# Patient Record
Sex: Male | Born: 1955 | Race: White | Hispanic: No | Marital: Married | Smoking: Former smoker
Health system: Southern US, Community
[De-identification: ages and names within clinical notes are randomized; demographics above are authoritative.]

---

## 2014-08-03 ENCOUNTER — Emergency Department (HOSPITAL_COMMUNITY)
Admission: EM | Admit: 2014-08-03 | Discharge: 2014-08-03 | Disposition: A | Discharge status: Home / Self Care | Payer: BLUE CROSS/BLUE SHIELD | Attending: Emergency Medicine | Admitting: Emergency Medicine

## 2014-08-03 ENCOUNTER — Encounter (HOSPITAL_COMMUNITY): Payer: Self-pay | Admitting: *Deleted

## 2014-08-03 ENCOUNTER — Emergency Department (HOSPITAL_COMMUNITY): Payer: BLUE CROSS/BLUE SHIELD

## 2014-08-03 DIAGNOSIS — R220 Localized swelling, mass and lump, head: Secondary | ICD-10-CM | POA: Diagnosis present

## 2014-08-03 DIAGNOSIS — D170 Benign lipomatous neoplasm of skin and subcutaneous tissue of head, face and neck: Secondary | ICD-10-CM | POA: Insufficient documentation

## 2014-08-03 DIAGNOSIS — Z87891 Personal history of nicotine dependence: Secondary | ICD-10-CM | POA: Diagnosis not present

## 2014-08-03 DIAGNOSIS — K115 Sialolithiasis: Secondary | ICD-10-CM | POA: Insufficient documentation

## 2014-08-03 LAB — I-STAT CHEM 8, ED
BUN: 10 mg/dL (ref 6–20)
CALCIUM ION: 1.21 mmol/L (ref 1.12–1.23)
CHLORIDE: 99 mmol/L — AB (ref 101–111)
Creatinine, Ser: 1 mg/dL (ref 0.61–1.24)
Glucose, Bld: 187 mg/dL — ABNORMAL HIGH (ref 65–99)
HEMATOCRIT: 47 % (ref 39.0–52.0)
Hemoglobin: 16 g/dL (ref 13.0–17.0)
Potassium: 4.4 mmol/L (ref 3.5–5.1)
Sodium: 139 mmol/L (ref 135–145)
TCO2: 26 mmol/L (ref 0–100)

## 2014-08-03 MED ORDER — CEPHALEXIN 500 MG PO CAPS: 500.0000 mg | ORAL_CAPSULE | Freq: Four times a day (QID) | ORAL | Status: AC

## 2014-08-03 MED ORDER — IOHEXOL 300 MG/ML  SOLN
75.0000 mL | Freq: Once | INTRAMUSCULAR | Status: AC | PRN
  Administered 2014-08-03: 75 mL via INTRAVENOUS

## 2014-08-03 NOTE — ED Provider Notes (Signed)
CSN: [id number]     Arrival date & time 08/03/14  2104 History   First MD Initiated Contact with Patient 08/03/14 2135     Chief Complaint  Patient presents with  . Facial Swelling     (Consider location/radiation/quality/duration/timing/severity/associated sxs/prior Treatment) HPI   Carl Mcguire is a 59 y.o. male who presents to the Emergency Department complaining of sudden onset of right jaw swelling.  Patient has a hx of a chronic mass to his right neck that was evaluated in 2002 that began as a "small cyst" that has gradually progressed in size over the years.  He states that he has "put off" having further medical evaluation due to work commitments.  Tonight, jaw swelling developed suddenly after eating.  He denies fever, dental pain, ear pain, chest pain, shortness of breath or nausea.   History reviewed. No pertinent past medical history. History reviewed. No pertinent past surgical history. History reviewed. No pertinent family history. History  Substance Use Topics  . Smoking status: Former Research scientist (life sciences)  . Smokeless tobacco: Not on file  . Alcohol Use: No    Review of Systems  Constitutional: Negative for fever and appetite change.  HENT: Negative for congestion, dental problem, ear pain, facial swelling, sore throat and trouble swallowing.        Right jaw swelling  Eyes: Negative for visual disturbance.  Respiratory: Negative for shortness of breath.   Cardiovascular: Negative for chest pain.  Gastrointestinal: Negative for nausea and abdominal pain.  Musculoskeletal: Negative for neck pain and neck stiffness.  Neurological: Negative for dizziness, syncope, facial asymmetry, speech difficulty and headaches.  Hematological: Negative for adenopathy.  All other systems reviewed and are negative.     Allergies  Review of patient's allergies indicates no known allergies.  Home Medications   Prior to Admission medications   Not on File   BP 173/99 mmHg  Pulse 79   Temp(Src) 98.5 F (36.9 C) (Oral)  Resp 20  Ht 6' 3.5" (1.918 m)  Wt 260 lb (117.935 kg)  BMI 32.06 kg/m2  SpO2 96% Physical Exam  Constitutional: He is oriented to person, place, and time. He appears well-developed and well-nourished. No distress.  HENT:  Head: Atraumatic.  Right Ear: Tympanic membrane and ear canal normal.  Left Ear: Tympanic membrane and ear canal normal.  Mouth/Throat: Uvula is midline, oropharynx is clear and moist and mucous membranes are normal. No oral lesions. No trismus in the jaw. No dental abscesses or uvula swelling.  Mild localized edema just distal to the angle of the right mandible. No dental tenderness.   Eyes: EOM are normal. Pupils are equal, round, and reactive to light.  Neck: No JVD present. No tracheal deviation present. No thyromegaly present.  Large, fluctuance mass to the right supraclavicular area.  No erythema or excessive warmth.    Cardiovascular: Normal rate, regular rhythm, normal heart sounds and intact distal pulses.   No murmur heard. Pulmonary/Chest: Effort normal and breath sounds normal. No respiratory distress.  Musculoskeletal: Normal range of motion.  Neurological: He is alert and oriented to person, place, and time. He exhibits normal muscle tone. Coordination normal.  Skin: Skin is warm. No rash noted.  Psychiatric: He has a normal mood and affect.  Nursing note and vitals reviewed.   ED Course  Procedures (including critical care time) Labs Review Labs Reviewed  I-STAT CHEM 8, ED - Abnormal; Notable for the following:    Chloride 99 (*)    Glucose, Bld 187 (*)  All other components within normal limits    Imaging Review Ct Soft Tissue Neck W Contrast  08/03/2014   CLINICAL DATA:  Right-sided neck swelling today with no known injury. Right shoulder mass which has been present for multiple years.  EXAM: CT NECK WITH CONTRAST  TECHNIQUE: Multidetector CT imaging of the neck was performed using the standard protocol  following the bolus administration of intravenous contrast.  CONTRAST:  1mL OMNIPAQUE IOHEXOL 300 MG/ML  SOLN  COMPARISON:  None.  FINDINGS: There is a 13 cm fatty mass in the right supraclavicular fossa and right neck with visible internal vessels but no suspicious nodularity or septation. There is displacement of the right external jugular vein and medialization of the right carotid sheath with partial effacement of the internal jugular vein. The lateral margin of the right sternocleidomastoid is displaced and thinned.  Pharynx and larynx: No asymmetric fullness or enhancement.  Salivary glands: Single stone in the right parotid gland. No evidence of active inflammation.  Thyroid: Unremarkable  Lymph nodes: No adenopathy.  Vascular: Venous distortion on the right as above. The major cervical vessels are patent.  Limited intracranial: Negative  Visualized orbits: Negative  Mastoids and visualized paranasal sinuses: There is patchy mucosal opacification in bilateral ethmoid sinuses. Probable mucous retention cysts in the inferior maxillary antra, nonobstructive.  Skeleton: No contributory findings.  Upper chest: Clear apical lungs.  IMPRESSION: 13 cm right neck and supraclavicular fossa lipoma. The mass distorts the sternocleidomastoid and internal/external jugular veins.   Electronically Signed   By: Monte Fantasia M.D.   On: 08/03/2014 22:53     EKG Interpretation None      MDM   Final diagnoses:  Parotid sialolithiasis  Lipoma of neck    Pt also seen by Dr. Christy Gentles and care plan discussed which includes chem 8 and CT scan of soft tissue neck    Pt appears comfortable.  Vitals stable,airway patent.    CT scan reveals likely lipoma and parotid gland stone.  Pt stable for d/c. rx for keflex, pt agrees to sour candy and close f/u.  Pt also given strict return precautions.      Kem Parkinson, PA-C 08/05/14 1400  Ripley Fraise, MD 08/05/14 2237

## 2014-08-03 NOTE — ED Notes (Signed)
Dr Christy Gentles at bedside,

## 2014-08-03 NOTE — ED Notes (Signed)
Pt alert & oriented x4, stable gait. Patient given discharge instructions, paperwork & prescription(s). Patient  instructed to stop at the registration desk to finish any additional paperwork. Patient verbalized understanding. Pt left department w/ no further questions. 

## 2014-08-03 NOTE — ED Notes (Signed)
Pt with right facial pain and swelling today, pt with large cyst to right side of neck for years, denies trouble breathing or swallowing

## 2016-04-05 IMAGING — CT CT NECK W/ CM
3 of 4 series · 13 of 33 positions shown, 16 images · IV contrast (omnipaque)
Comparison: None.

CLINICAL DATA: Right-sided neck swelling today with no known
injury. Right shoulder mass which has been present for multiple
years.

EXAM:
CT NECK WITH CONTRAST
TECHNIQUE: Multidetector CT imaging of the neck was performed using the
standard protocol following the bolus administration of intravenous
contrast.
CONTRAST:  75mL OMNIPAQUE IOHEXOL 300 MG/ML  SOLN

[Series 2: soft tissue neck 2.0 b31s · axial · 0.65mm/px · z∈[-84,+100]mm · 5 of 139 slices shown, 7 images]
[im 24/139  soft-tissue]
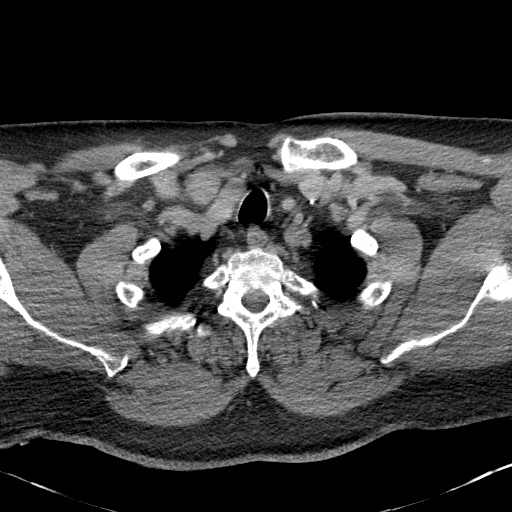
[im 24/139  bone]
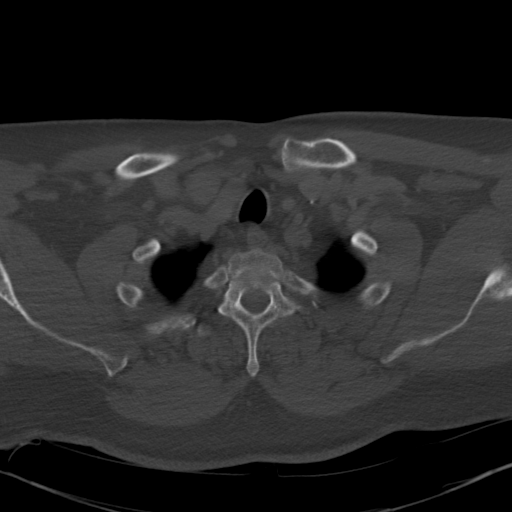
[im 47/139  bone]
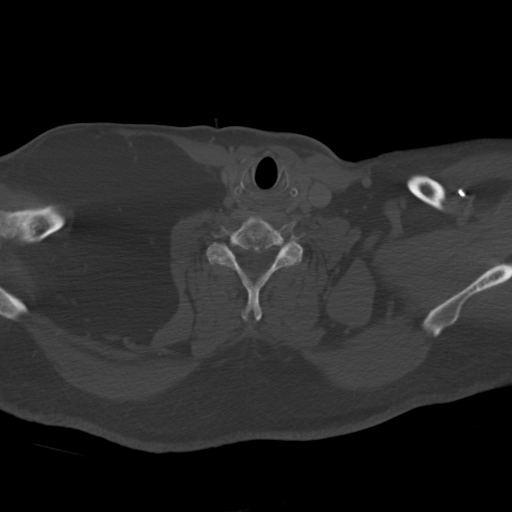
[im 70/139  bone]
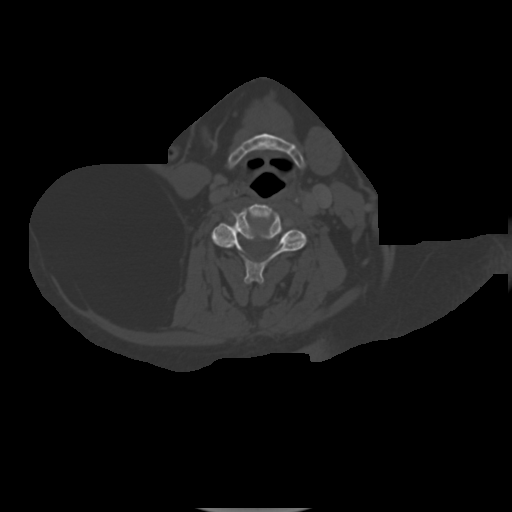
[im 93/139  bone]
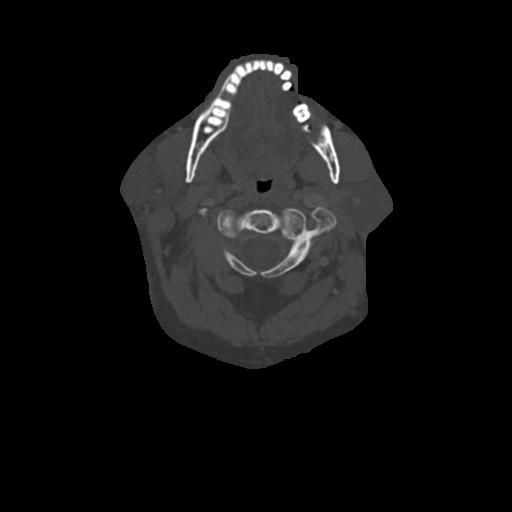
[im 116/139  soft-tissue]
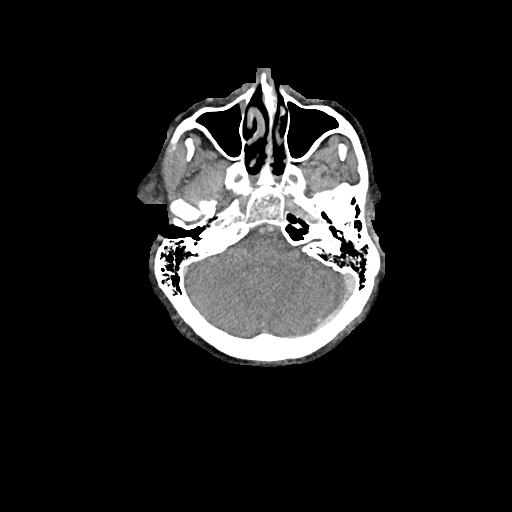
[im 116/139  bone]
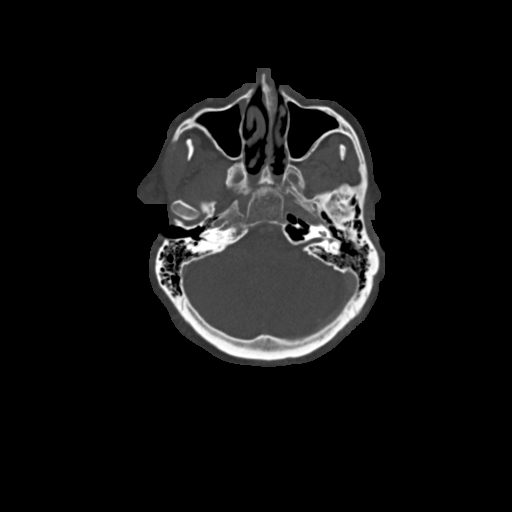

[Series 4: neck 2.0 soft tissue sag · sagittal · 0.55mm/px · 5 of 179 slices shown, 6 images]
[im 60/179  bone]
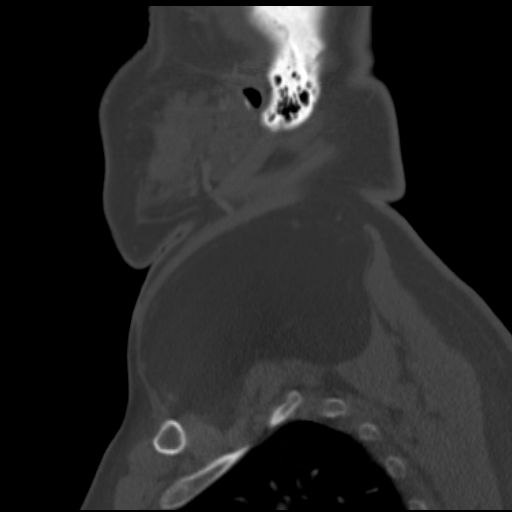
[im 75/179  bone]
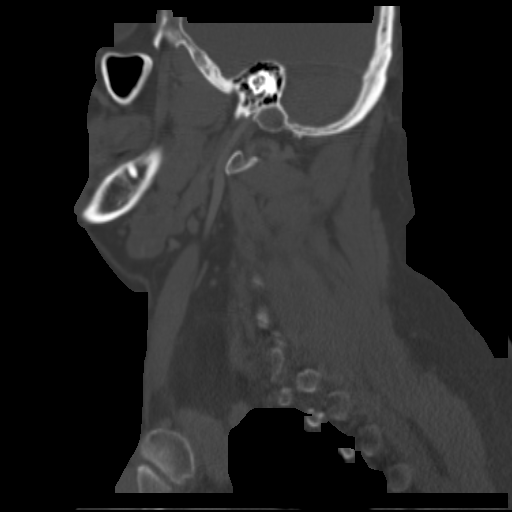
[im 90/179  soft-tissue]
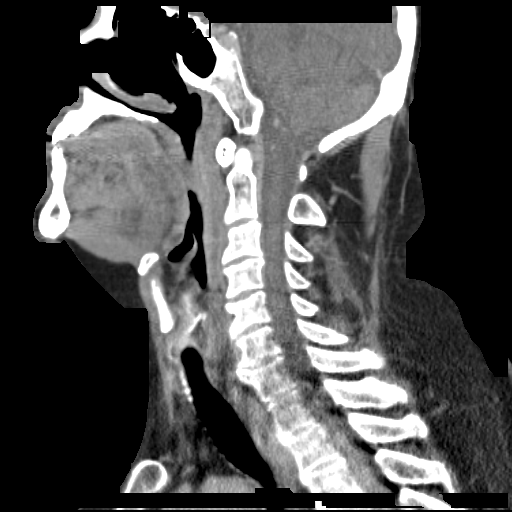
[im 90/179  bone]
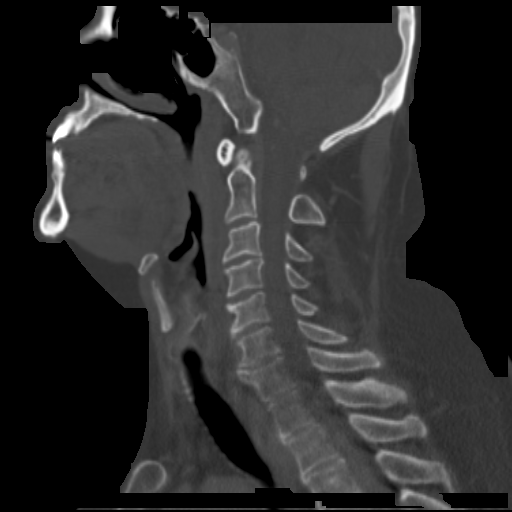
[im 104/179  bone]
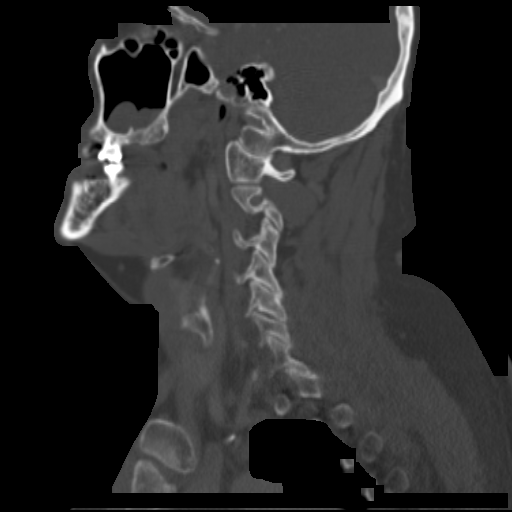
[im 119/179  bone]
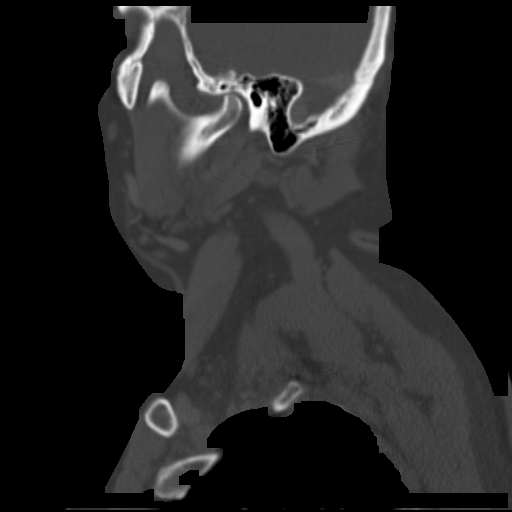

[Series 5: neck 2.0 soft tissue coro · coronal · 0.54mm/px · 3 of 128 slices shown]
[im 26/128  bone]
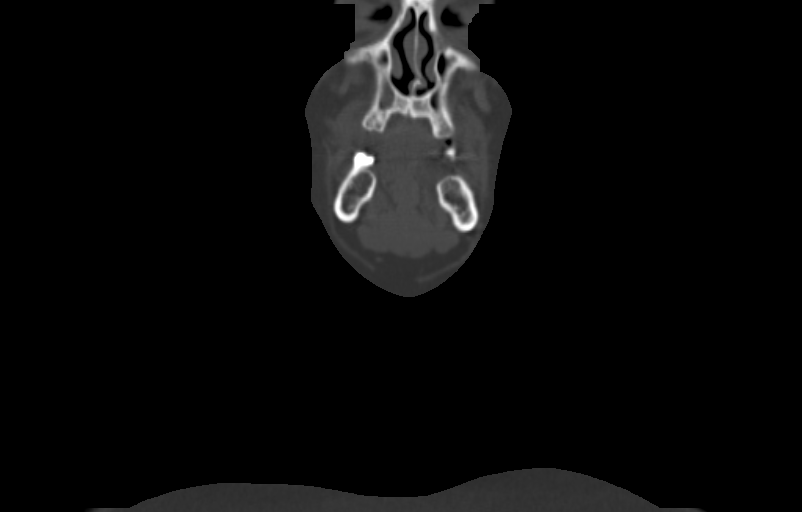
[im 51/128  bone]
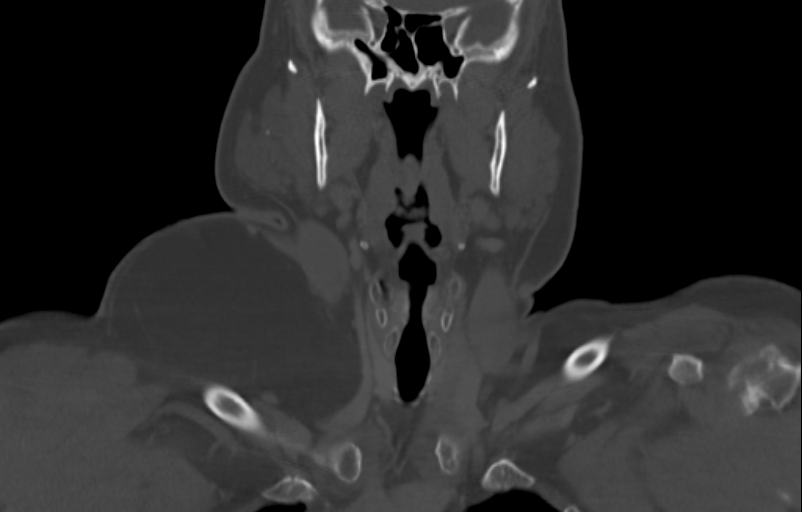
[im 77/128  bone]
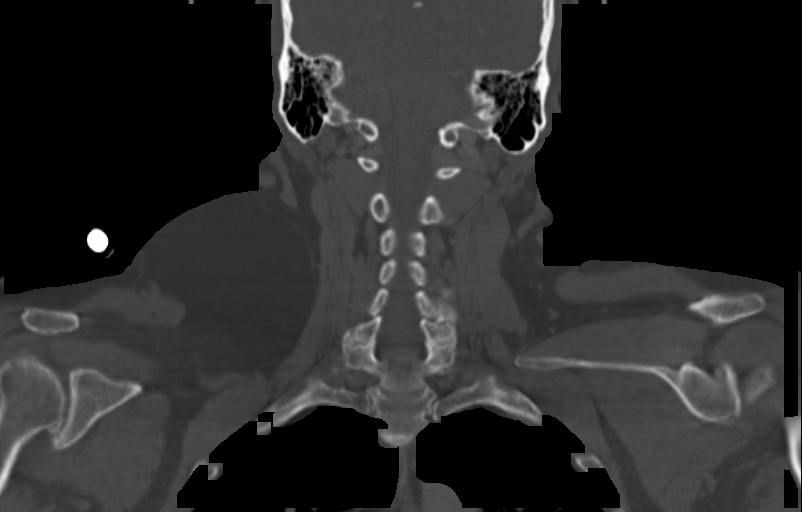

[13 of 33 positions shown; findings below may reference images not displayed]

FINDINGS: There is a 13 cm fatty mass in the right supraclavicular fossa and
right neck with visible internal vessels but no suspicious
nodularity or septation. There is displacement of the right external
jugular vein and medialization of the right carotid sheath with
partial effacement of the internal jugular vein. The lateral margin
of the right sternocleidomastoid is displaced and thinned.

Pharynx and larynx: No asymmetric fullness or enhancement.

Salivary glands: Single stone in the right parotid gland. No
evidence of active inflammation.

Thyroid: Unremarkable

Lymph nodes: No adenopathy.

Vascular: Venous distortion on the right as above. The major
cervical vessels are patent.

Limited intracranial: Negative

Visualized orbits: Negative

Mastoids and visualized paranasal sinuses: There is patchy mucosal
opacification in bilateral ethmoid sinuses. Probable mucous
retention cysts in the inferior maxillary antra, nonobstructive.

Skeleton: No contributory findings.

Upper chest: Clear apical lungs.
IMPRESSION: 13 cm right neck and supraclavicular fossa lipoma. The mass distorts
the sternocleidomastoid and internal/external jugular veins.
